# Patient Record
Sex: Female | Born: 1980 | Hispanic: No | Marital: Married | Smoking: Never smoker
Health system: Southern US, Community
[De-identification: ages and names within clinical notes are randomized; demographics above are authoritative.]

---

## 2014-06-28 ENCOUNTER — Other Ambulatory Visit: Payer: Self-pay | Admitting: Family Medicine

## 2014-06-28 DIAGNOSIS — E041 Nontoxic single thyroid nodule: Secondary | ICD-10-CM

## 2014-07-03 ENCOUNTER — Ambulatory Visit
Admission: RE | Admit: 2014-07-03 | Discharge: 2014-07-03 | Disposition: A | Discharge status: Home / Self Care | Payer: 59 | Source: Ambulatory Visit | Attending: Family Medicine | Admitting: Family Medicine

## 2014-07-03 DIAGNOSIS — E041 Nontoxic single thyroid nodule: Secondary | ICD-10-CM

## 2014-08-06 ENCOUNTER — Ambulatory Visit (INDEPENDENT_AMBULATORY_CARE_PROVIDER_SITE_OTHER): Payer: 59 | Admitting: Internal Medicine

## 2014-08-06 ENCOUNTER — Encounter: Payer: Self-pay | Admitting: Internal Medicine

## 2014-08-06 VITALS — BP 108/68 | HR 119 | Temp 98.0°F | Resp 14 | Ht 64.0 in | Wt 153.0 lb

## 2014-08-06 DIAGNOSIS — E041 Nontoxic single thyroid nodule: Secondary | ICD-10-CM | POA: Insufficient documentation

## 2014-08-06 NOTE — Patient Instructions (Signed)
Please return in a year.   Please let me know if you plan to start trying for a pregnancy.  Also, please let me know if you develop the following symptoms:  Thyroid Cyst The thyroid gland is a butterfly-shaped gland in the middle of the neck, located just below the voice box. It makes thyroid hormone. Thyroid hormone has an effect on nearly all tissues of your body by regulating your metabolism. Metabolism is the breakdown and use of food that you eat or energy that is stored in your body. Your metabolism affects your heart rate, blood pressure, body temperature, and weight. Thyroid cysts are enlarged fluid filled regions of the thyroid gland. These cysts range in size and may expand and enlarge suddenly. Rapidly expanding cysts may cause pain, difficulty swallowing, and rarely, difficulty breathing. Most cysts of the thyroid are not cancerous (benign). SYMPTOMS Bleeding may occur within the cyst. If the bleeding is severe, the cyst may get larger and produce problems in the neck, including swelling that may produce pain and difficultly swallowing. If the vocal cords are compressed, hoarseness may occur. If the windpipe is compressed, you may have difficulty breathing. DIAGNOSIS  A thyroid cyst is diagnosed through physical exam. The diagnosis can be confirmed by an ultrasound exam of the neck. This creates a picture by bouncing sound waves off the thyroid gland. Sometimes the cysts are drained using a fine needle. The fluid is then sent to the lab where it can be examined. This is done to see if any cells in the fluid are cancerous. If they are found to be cancerous, you will need further treatment.  TREATMENT  If the fluid in your neck does not show evidence of cancer, your caregiver may just want to monitor you with yearly ultrasound exams. Sometimes cysts need to be removed surgically. Document Released: 04/23/2004 Document Revised: 08/23/2011 Document Reviewed: 08/06/2010 [facility] Patient  Information 2015 [employer], [place]. This information is not intended to replace advice given to you by your health care provider. Make sure you discuss any questions you have with your health care provider.

## 2014-08-06 NOTE — Progress Notes (Signed)
Patient ID: Jeanne Ward, female   DOB: 01-23-1981, 34 y.o.   MRN: [id number]   HPI  Jeanne Ward is a 34 y.o.-year-old female, referred by her PCP, Dr. Zachery Dauer, for management of a thyroid nodule.  Patient has a history of a thyroid nodule (detected as she looked in the mirror) detected when she was pregnant, previously biopsied in [place], with benign results.  Thyroid U/S (07/03/2014): Dominant 35 x 19 x 23 mm mostly solid nodule, mid lobe.  I reviewed pt's thyroid tests: 06/29/2014: TSH 2.73, free T4 0.84.   Pt can see the nodule in neck, hoarseness, dysphagia/odynophagia, SOB with lying down. She mostly feels discomfort around her periods.  Pt denies: - heat intolerance/cold intolerance - tremors - palpitations - anxiety/depression - hyperdefecation/constipation - weight loss - weight gain - dry skin - hair falling - problems with concentration - fatigue  Pt does have a FH of thyroid ds.: father Chiropractor), sister and daughter Chiropractor). No FH of thyroid cancer. No h/o radiation tx to head or neck.  No seaweed or kelp, no recent contrast studies. No steroid use. No herbal supplements.   ROS: Constitutional: no weight gain/loss, no fatigue, no subjective hyperthermia/hypothermia Eyes: no blurry vision, no xerophthalmia ENT: no sore throat, + nodules palpated in throat, no dysphagia/odynophagia, no hoarseness Cardiovascular: no CP/SOB/palpitations/leg swelling Respiratory: no cough/SOB Gastrointestinal: no N/V/D/C Musculoskeletal: no muscle/joint aches Skin: no rashes Neurological: no tremors/numbness/tingling/dizziness Psychiatric: no depression/anxiety  No past medical history other than the thyroid nodule.   No past surgical history.   History   Social History  . Marital Status: Married    Spouse Name: N/A  . Number of Children: 2   Occupational History  . N/a   Social History Main Topics  . Smoking status: Never Smoker   . Smokeless tobacco: Not on file   . Alcohol Use: No  . Drug Use: No   Current Outpatient Rx  Name  Route  Sig  Dispense  Refill  . Multiple Vitamin (MULTIVITAMIN) tablet   Oral   Take 2 tablets by mouth daily.          No Known Allergies   FH: - see HPI - DM1 in 68 y/o daughter - cancer in aunt and cousin  PE: BP 108/68 mmHg  Pulse 119  Temp(Src) 98 F (36.7 C) (Oral)  Resp 14  Ht  (1.626 m)  Wt 153 lb (69.4 kg)  BMI 26.25 kg/m2  SpO2 98% Wt Readings from Last 3 Encounters:  08/06/14 153 lb (69.4 kg)   Constitutional: overweight, in NAD Eyes: PERRLA, EOMI, no exophthalmos ENT: moist mucous membranes, + thyromegaly, no cervical lymphadenopathy Cardiovascular: tachycardia, RR, No MRG Respiratory: CTA B Gastrointestinal: abdomen soft, NT, ND, BS+ Musculoskeletal: no deformities, strength intact in all 4;  Skin: moist, warm, no rashes Neurological: no tremor with outstretched hands, DTR normal in all 4  ASSESSMENT: 1. Thyroid nodule - thyroid U/S (07/03/2014):  Right thyroid lobe: 40 x 14 x 13 mm. Mildly inhomogeneous echotexture without focal lesion.  Left thyroid lobe: 46 x 22 x 25 mm. Dominant 35 x 19 x 23 mm mostly solid nodule, mid lobe.  Isthmus Thickness: 3 mm. No nodules visualized.  Lymphadenopathy None visualized.  - 07/28/2012: Left nodule FNA obtained in [place] was negative for malignancy  PLAN: 1. Large left thyroid nodule - I reviewed the images of her thyroid ultrasound along with the patient and her husband. I pointed out that the dominant nodule is large, this being  a risk factor for cancer. Otherwise, the nodule is isoechoic, containing lakes of colloid, without calcifications, without internal blood flow, more wide than tall, and well delimited from surrounding tissue. Pt does not have a thyroid cancer family history or a personal history of RxTx to head/neck. She also has a negative FNA in 2014. All these would favor benignity.   - We discussed about waiting for  another year and see if the nodule grows, and only reBx at that time. If the nodule accumulates more fluid and causes compression sxs, we will need to drain it. I advised the pt to let me know if she wants to become pregnant, in that case, we may need to Bx the nodule >> if 2 negative Bx's, the chances of Ca are virtually 0. - I explained that we can continue to follow her on a yearly basis, and check another ultrasound in another year or 2. - reviewed TFTs >> normal >> no need to repeat - Return in about 1 year (around 08/07/2015).

## 2016-05-13 IMAGING — US US SOFT TISSUE HEAD/NECK
1 series · 14 of 25 positions shown · non-contrast
Comparison: None available

CLINICAL DATA: Thyroid nodule. Previous biopsy of left lesion 2
years ago, negative by report.

EXAM:
THYROID ULTRASOUND
TECHNIQUE: Ultrasound examination of the thyroid gland and adjacent soft
tissues was performed.

[Series 1: us soft tissue head/neck · 0.07mm/px · 14 of 38 slices shown]
[im 1/38]
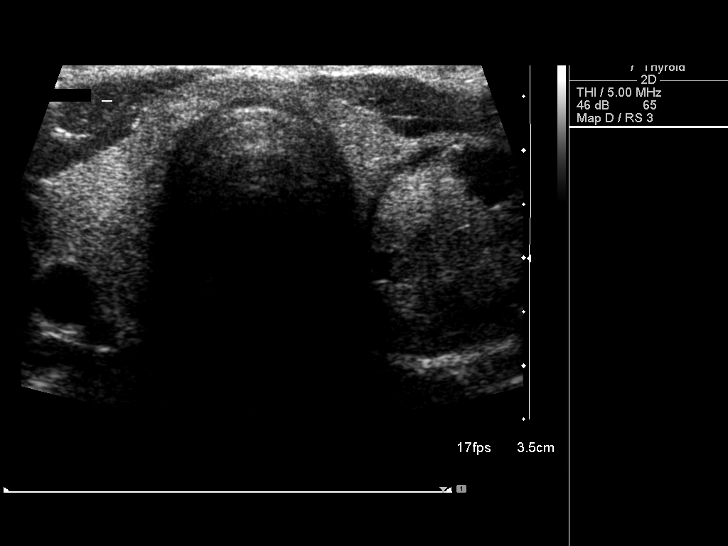
[im 4/38]
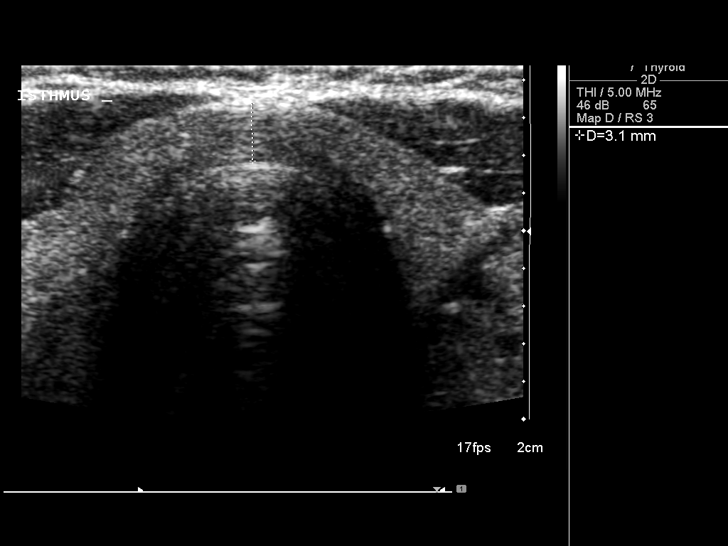
[im 7/38]
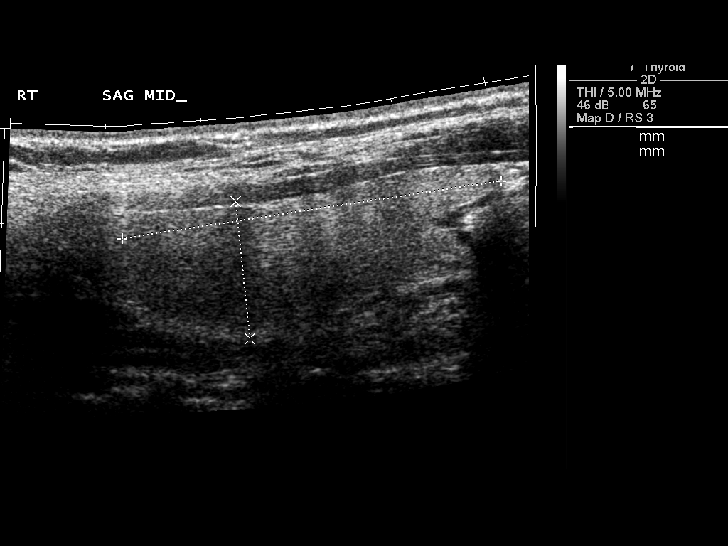
[im 10/38]
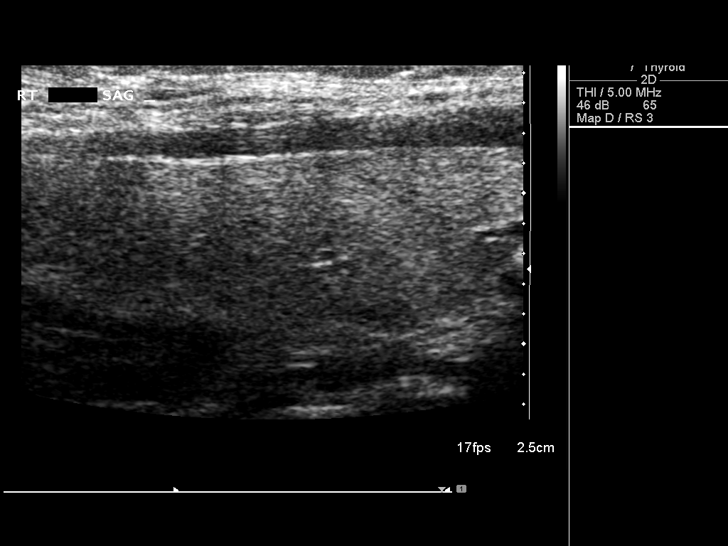
[im 13/38]
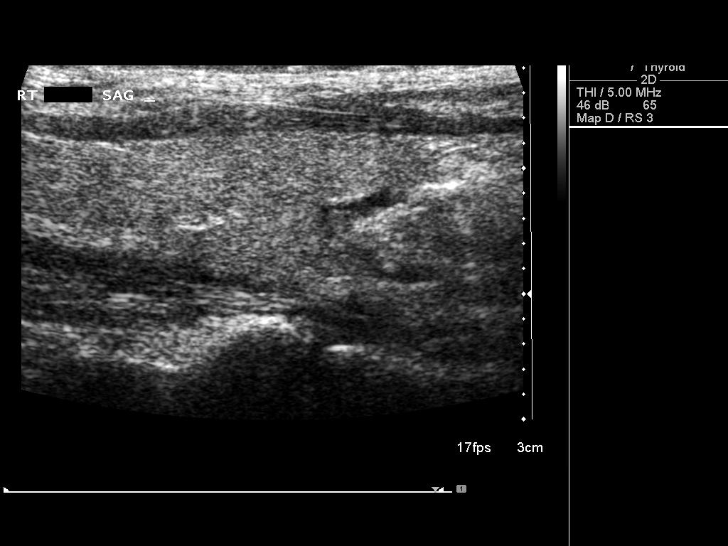
[im 14/38]
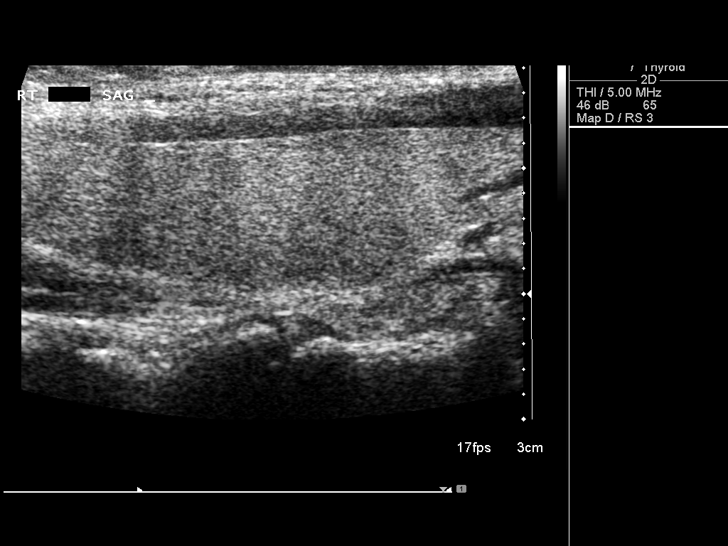
[im 17/38]
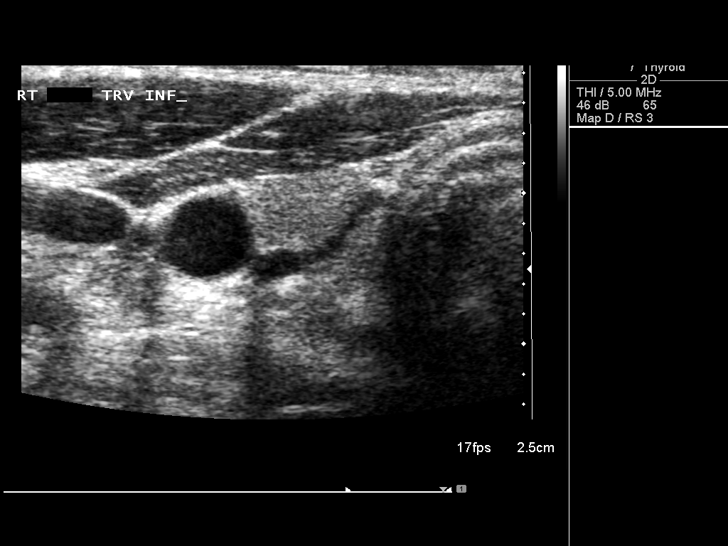
[im 21/38]
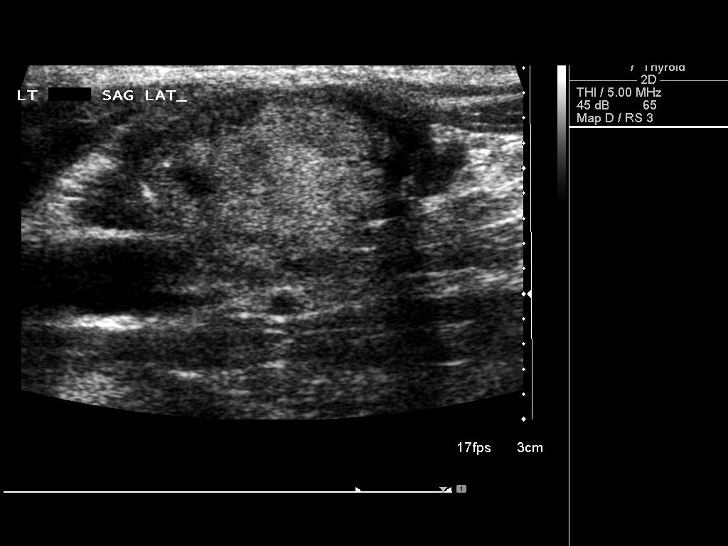
[im 24/38]
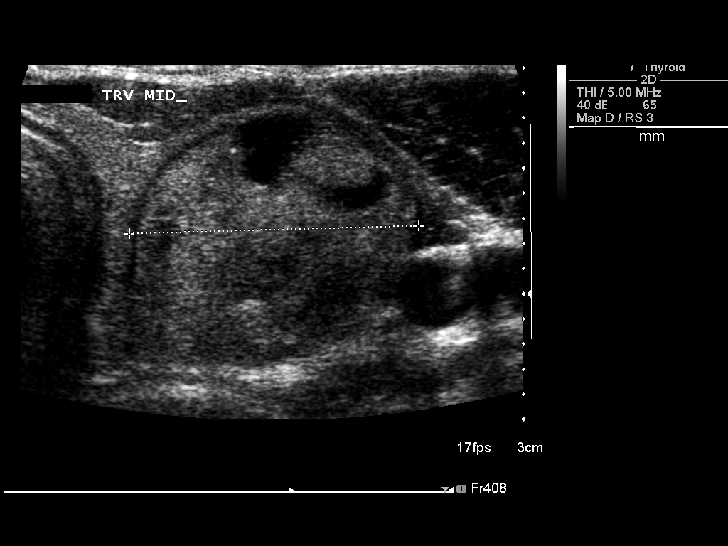
[im 25/38]
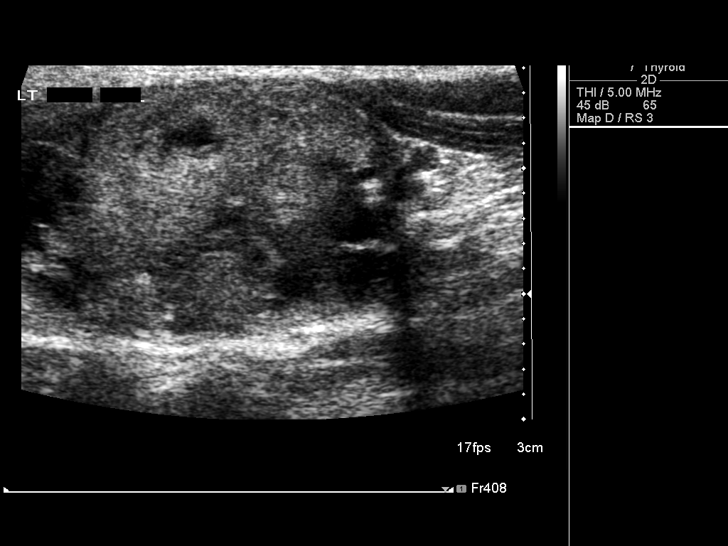
[im 28/38]
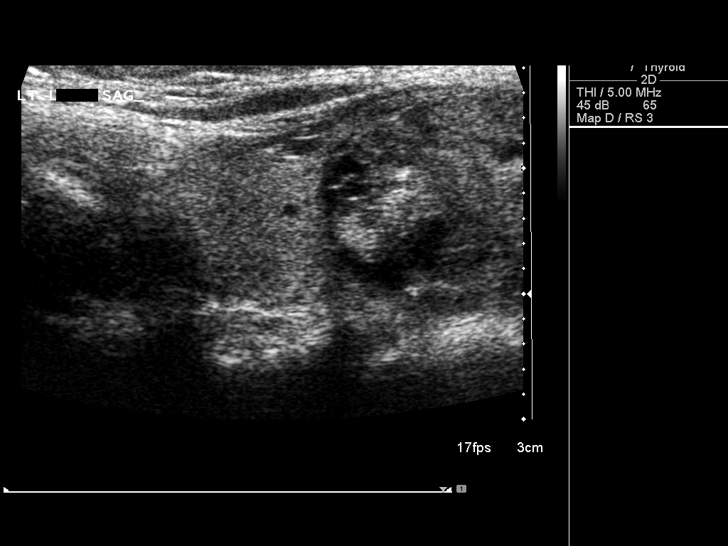
[im 31/38]
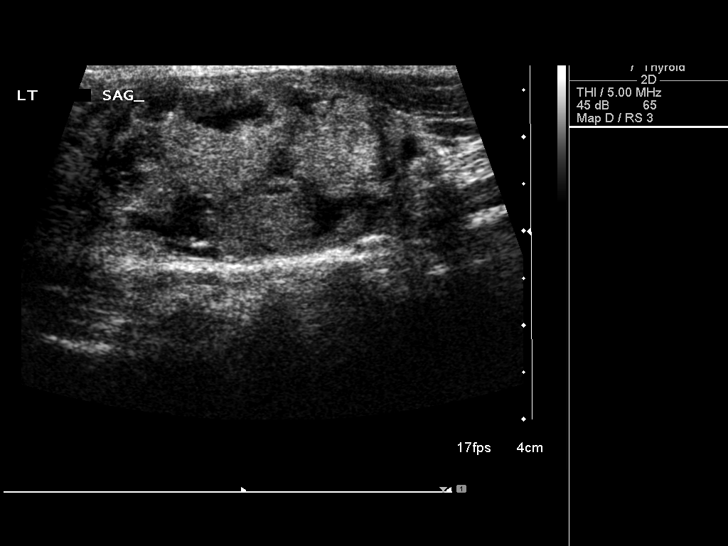
[im 34/38]
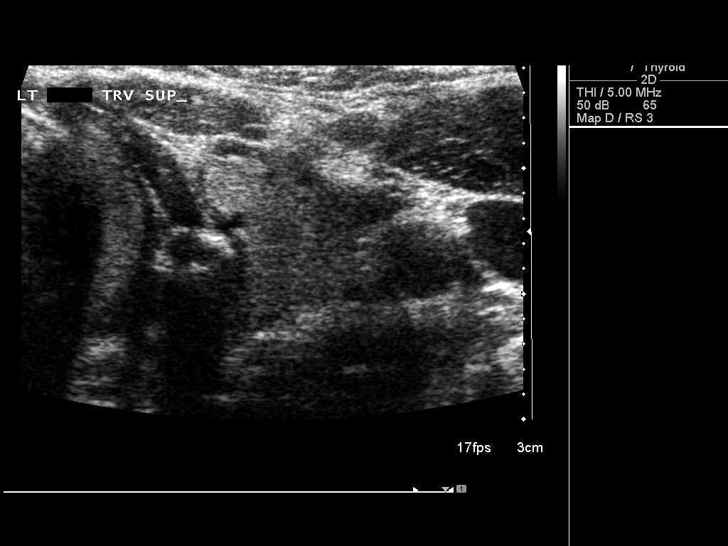
[im 38/38]
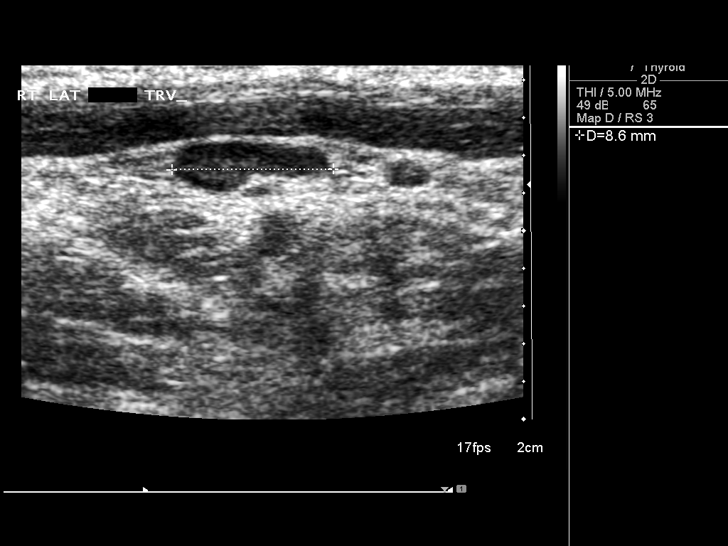

[14 of 25 positions shown; findings below may reference images not displayed]

FINDINGS: Right thyroid lobe

Measurements: 40 x 14 x 13 mm. Mildly inhomogeneous echotexture
without focal lesion.

Left thyroid lobe

Measurements: 46 x 22 x 25 mm. Dominant 35 x 19 x 23 mm mostly solid
nodule, mid lobe.

Isthmus

Thickness: 3 mm.  No nodules visualized.

Lymphadenopathy

None visualized.
IMPRESSION: 1. Solitary 3.5 cm left thyroid nodule. Findings meet consensus
criteria for biopsy. If not already performed, Ultrasound-guided
fine needle aspiration should be considered, if as per the consensus
statement: Management of Thyroid Nodules Detected at US: Society of
Radiologists in Ultrasound Consensus Conference Statement. Radiology
